# Patient Record
Sex: Male | Born: 1975 | Race: White | Hispanic: No | Marital: Married | Smoking: Never smoker
Health system: Southern US, Community
[De-identification: ages and names within clinical notes are randomized; demographics above are authoritative.]

## PROBLEM LIST (undated history)

## (undated) DIAGNOSIS — I10 Essential (primary) hypertension: Secondary | ICD-10-CM

---

## 2008-01-08 ENCOUNTER — Emergency Department (HOSPITAL_COMMUNITY): Admission: EM | Admit: 2008-01-08 | Discharge: 2008-01-08 | Payer: Self-pay | Admitting: Emergency Medicine

## 2016-02-06 DIAGNOSIS — D3131 Benign neoplasm of right choroid: Secondary | ICD-10-CM | POA: Diagnosis not present

## 2016-07-26 DIAGNOSIS — Z0000 Encounter for general adult medical examination without abnormal findings: Secondary | ICD-10-CM | POA: Diagnosis not present

## 2016-07-26 DIAGNOSIS — Z23 Encounter for immunization: Secondary | ICD-10-CM | POA: Diagnosis not present

## 2016-07-31 DIAGNOSIS — Z13220 Encounter for screening for lipoid disorders: Secondary | ICD-10-CM | POA: Diagnosis not present

## 2016-07-31 DIAGNOSIS — I10 Essential (primary) hypertension: Secondary | ICD-10-CM | POA: Diagnosis not present

## 2016-08-24 DIAGNOSIS — H9201 Otalgia, right ear: Secondary | ICD-10-CM | POA: Diagnosis not present

## 2016-08-24 DIAGNOSIS — H65191 Other acute nonsuppurative otitis media, right ear: Secondary | ICD-10-CM | POA: Diagnosis not present

## 2017-07-30 DIAGNOSIS — Z0000 Encounter for general adult medical examination without abnormal findings: Secondary | ICD-10-CM | POA: Diagnosis not present

## 2017-07-30 DIAGNOSIS — Z13220 Encounter for screening for lipoid disorders: Secondary | ICD-10-CM | POA: Diagnosis not present

## 2017-07-30 DIAGNOSIS — I10 Essential (primary) hypertension: Secondary | ICD-10-CM | POA: Diagnosis not present

## 2018-08-07 DIAGNOSIS — I10 Essential (primary) hypertension: Secondary | ICD-10-CM | POA: Diagnosis not present

## 2018-08-07 DIAGNOSIS — Z6833 Body mass index (BMI) 33.0-33.9, adult: Secondary | ICD-10-CM | POA: Diagnosis not present

## 2018-08-07 DIAGNOSIS — Z0000 Encounter for general adult medical examination without abnormal findings: Secondary | ICD-10-CM | POA: Diagnosis not present

## 2018-08-07 DIAGNOSIS — Z125 Encounter for screening for malignant neoplasm of prostate: Secondary | ICD-10-CM | POA: Diagnosis not present

## 2018-11-15 ENCOUNTER — Other Ambulatory Visit: Payer: Self-pay

## 2018-11-15 ENCOUNTER — Emergency Department (HOSPITAL_BASED_OUTPATIENT_CLINIC_OR_DEPARTMENT_OTHER): Payer: BLUE CROSS/BLUE SHIELD

## 2018-11-15 ENCOUNTER — Encounter (HOSPITAL_BASED_OUTPATIENT_CLINIC_OR_DEPARTMENT_OTHER): Payer: Self-pay | Admitting: Emergency Medicine

## 2018-11-15 ENCOUNTER — Emergency Department (HOSPITAL_BASED_OUTPATIENT_CLINIC_OR_DEPARTMENT_OTHER)
Admission: EM | Admit: 2018-11-15 | Discharge: 2018-11-15 | Disposition: A | Discharge status: Home / Self Care | Payer: BLUE CROSS/BLUE SHIELD | Attending: Emergency Medicine | Admitting: Emergency Medicine

## 2018-11-15 DIAGNOSIS — S82831A Other fracture of upper and lower end of right fibula, initial encounter for closed fracture: Secondary | ICD-10-CM

## 2018-11-15 DIAGNOSIS — Y9389 Activity, other specified: Secondary | ICD-10-CM | POA: Diagnosis not present

## 2018-11-15 DIAGNOSIS — Y998 Other external cause status: Secondary | ICD-10-CM | POA: Insufficient documentation

## 2018-11-15 DIAGNOSIS — W1789XA Other fall from one level to another, initial encounter: Secondary | ICD-10-CM | POA: Diagnosis not present

## 2018-11-15 DIAGNOSIS — Y929 Unspecified place or not applicable: Secondary | ICD-10-CM | POA: Diagnosis not present

## 2018-11-15 DIAGNOSIS — I10 Essential (primary) hypertension: Secondary | ICD-10-CM | POA: Insufficient documentation

## 2018-11-15 DIAGNOSIS — S8991XA Unspecified injury of right lower leg, initial encounter: Secondary | ICD-10-CM | POA: Diagnosis not present

## 2018-11-15 HISTORY — DX: Essential (primary) hypertension: I10

## 2018-11-15 MED ORDER — IBUPROFEN 400 MG PO TABS
600.0000 mg | ORAL_TABLET | Freq: Once | ORAL | Status: AC
  Administered 2018-11-15: 600 mg via ORAL
  Filled 2018-11-15: qty 1

## 2018-11-15 MED ORDER — OXYCODONE-ACETAMINOPHEN 5-325 MG PO TABS
1.0000 | ORAL_TABLET | Freq: Once | ORAL | Status: AC
  Administered 2018-11-15: 16:00:00 1 via ORAL
  Filled 2018-11-15: qty 1

## 2018-11-15 NOTE — ED Provider Notes (Signed)
[facility] EMERGENCY DEPARTMENT Provider Note   CSN: [id number] Arrival date & time: 11/15/18  1439    History   Chief Complaint Chief Complaint  Patient presents with  . Fall    HPI Anthony Ellison is a 43 y.o. male.     HPI 43 year old male was working on a ladder when the ladder began to slide.  He landed on the ground injuring his right ankle.  She reported he struck his head as well but is not on blood thinners and denies headache or vomiting.  Denies neck pain.  Denies weakness in his arms or legs.  No back pain.  No hip or knee pain.  Pain is focused to the right ankle and is moderate in severity.  No medications prior to arrival.  Presents with swelling to the right ankle.   Past Medical History:  Diagnosis Date  . Hypertension     There are no active problems to display for this patient.   History reviewed. No pertinent surgical history.      Home Medications    Prior to Admission medications   Not on File    Family History No family history on file.  Social History Social History   Tobacco Use  . Smoking status: Never Smoker  . Smokeless tobacco: Never Used  Substance Use Topics  . Alcohol use: Not Currently  . Drug use: Not on file     Allergies   Patient has no known allergies.   Review of Systems Review of Systems  All other systems reviewed and are negative.    Physical Exam Updated Vital Signs BP 112/76 (BP Location: Right Arm)   Pulse 78   Temp 98.7 F (37.1 C) (Oral)   Resp 16   Ht 5\' 10"  (1.778 m)   Wt 104.3 kg   SpO2 100%   BMI 33.00 kg/m   Physical Exam Vitals signs and nursing note reviewed.  Constitutional:      Appearance: He is well-developed.  HENT:     Head: Normocephalic.  Neck:     Musculoskeletal: Normal range of motion.  Pulmonary:     Effort: Pulmonary effort is normal.  Abdominal:     General: There is no distension.  Musculoskeletal: Normal range of motion.     Comments: Normal  pulses right foot.  Swelling of both the medial lateral malleolus with tenderness over the lateral malleolus.  Full range of motion of right hip and right knee.  Neurological:     Mental Status: He is alert and oriented to person, place, and time.      ED Treatments / Results  Labs (all labs ordered are listed, but only abnormal results are displayed) Labs Reviewed - No data to display  EKG None  Radiology Dg Ankle Complete Right  Result Date: 11/15/2018 CLINICAL DATA:  Pain after trauma EXAM: RIGHT ANKLE - COMPLETE 3+ VIEW COMPARISON:  None. FINDINGS: Soft tissue swelling is seen, particularly medially. There is a fracture through the distal tip of the fibula. No other fractures noted. IMPRESSION: Soft tissue swelling is identified, most prominent medially. There is a fracture through the distal tip of the fibula. No other abnormalities. Electronically Signed   By: Gerome Sam III M.D   On: 11/15/2018 15:11    Procedures .Splint Application Performed by: Azalia Bilis, MD Authorized by: Azalia Bilis, MD     SPLINT APPLICATION Authorized by: Azalia Bilis Consent: Verbal consent obtained. Risks and benefits: risks, benefits and alternatives were  discussed Consent given by: patient Splint applied by: orthopedic technician Location details: right lower extremity Splint type: cam walker Supplies used: cam walker Post-procedure: The splinted body part was neurovascularly unchanged following the procedure. Patient tolerance: Patient tolerated the procedure well with no immediate complications.     Medications Ordered in ED Medications  ibuprofen (ADVIL) tablet 600 mg (600 mg Oral Given 11/15/18 1537)  oxyCODONE-acetaminophen (PERCOCET/ROXICET) 5-325 MG per tablet 1 tablet (1 tablet Oral Given 11/15/18 1537)     Initial Impression / Assessment and Plan / ED Course  I have reviewed the triage vital signs and the nursing notes.  Pertinent labs & imaging results that were  available during my care of the patient were reviewed by me and considered in my medical decision making (see chart for details).        CAM Walker.  Crutches.  Orthopedic follow-up.  No other injury.  Elevation ice recommended  Final Clinical Impressions(s) / ED Diagnoses   Final diagnoses:  Closed fracture of distal end of right fibula, unspecified fracture morphology, initial encounter    ED Discharge Orders    None       Azalia Bilisampos, [place] Zaragoza, MD 11/15/18 1654

## 2018-11-15 NOTE — Discharge Instructions (Signed)
Ibuprofen Ice Call the orthopedist for follow up.

## 2018-11-15 NOTE — ED Notes (Signed)
ED Provider at bedside. 

## 2018-11-15 NOTE — ED Notes (Addendum)
Patient  X-ray at bedside 

## 2018-11-15 NOTE — ED Triage Notes (Signed)
Pt states he fell 10 feet off of a ladder only injuring his R ankle. Denies LOC

## 2018-11-18 DIAGNOSIS — S8264XA Nondisplaced fracture of lateral malleolus of right fibula, initial encounter for closed fracture: Secondary | ICD-10-CM | POA: Diagnosis not present

## 2018-11-18 DIAGNOSIS — M25571 Pain in right ankle and joints of right foot: Secondary | ICD-10-CM | POA: Diagnosis not present

## 2018-12-09 DIAGNOSIS — M25571 Pain in right ankle and joints of right foot: Secondary | ICD-10-CM | POA: Diagnosis not present

## 2018-12-09 DIAGNOSIS — S8264XA Nondisplaced fracture of lateral malleolus of right fibula, initial encounter for closed fracture: Secondary | ICD-10-CM | POA: Diagnosis not present

## 2018-12-10 DIAGNOSIS — S8261XD Displaced fracture of lateral malleolus of right fibula, subsequent encounter for closed fracture with routine healing: Secondary | ICD-10-CM | POA: Diagnosis not present

## 2019-06-29 DIAGNOSIS — H5213 Myopia, bilateral: Secondary | ICD-10-CM | POA: Diagnosis not present

## 2019-11-17 DIAGNOSIS — Z125 Encounter for screening for malignant neoplasm of prostate: Secondary | ICD-10-CM | POA: Diagnosis not present

## 2019-11-17 DIAGNOSIS — H9202 Otalgia, left ear: Secondary | ICD-10-CM | POA: Diagnosis not present

## 2019-11-17 DIAGNOSIS — I10 Essential (primary) hypertension: Secondary | ICD-10-CM | POA: Diagnosis not present

## 2019-11-22 DIAGNOSIS — R110 Nausea: Secondary | ICD-10-CM | POA: Diagnosis not present

## 2019-11-22 DIAGNOSIS — H6692 Otitis media, unspecified, left ear: Secondary | ICD-10-CM | POA: Diagnosis not present

## 2019-11-22 DIAGNOSIS — T887XXA Unspecified adverse effect of drug or medicament, initial encounter: Secondary | ICD-10-CM | POA: Diagnosis not present

## 2020-02-05 ENCOUNTER — Other Ambulatory Visit: Payer: Self-pay

## 2020-02-05 ENCOUNTER — Emergency Department (HOSPITAL_COMMUNITY): Payer: BC Managed Care – PPO

## 2020-02-05 ENCOUNTER — Emergency Department (HOSPITAL_COMMUNITY)
Admission: EM | Admit: 2020-02-05 | Discharge: 2020-02-05 | Disposition: A | Discharge status: Home / Self Care | Payer: BC Managed Care – PPO | Attending: Emergency Medicine | Admitting: Emergency Medicine

## 2020-02-05 DIAGNOSIS — Y998 Other external cause status: Secondary | ICD-10-CM | POA: Diagnosis not present

## 2020-02-05 DIAGNOSIS — S52592A Other fractures of lower end of left radius, initial encounter for closed fracture: Secondary | ICD-10-CM | POA: Diagnosis not present

## 2020-02-05 DIAGNOSIS — Y9389 Activity, other specified: Secondary | ICD-10-CM | POA: Insufficient documentation

## 2020-02-05 DIAGNOSIS — R52 Pain, unspecified: Secondary | ICD-10-CM | POA: Diagnosis not present

## 2020-02-05 DIAGNOSIS — S52502A Unspecified fracture of the lower end of left radius, initial encounter for closed fracture: Secondary | ICD-10-CM

## 2020-02-05 DIAGNOSIS — R609 Edema, unspecified: Secondary | ICD-10-CM | POA: Diagnosis not present

## 2020-02-05 DIAGNOSIS — W010XXA Fall on same level from slipping, tripping and stumbling without subsequent striking against object, initial encounter: Secondary | ICD-10-CM | POA: Diagnosis not present

## 2020-02-05 DIAGNOSIS — S52592D Other fractures of lower end of left radius, subsequent encounter for closed fracture with routine healing: Secondary | ICD-10-CM | POA: Diagnosis not present

## 2020-02-05 DIAGNOSIS — I10 Essential (primary) hypertension: Secondary | ICD-10-CM | POA: Diagnosis not present

## 2020-02-05 DIAGNOSIS — R110 Nausea: Secondary | ICD-10-CM | POA: Diagnosis not present

## 2020-02-05 DIAGNOSIS — R0902 Hypoxemia: Secondary | ICD-10-CM | POA: Diagnosis not present

## 2020-02-05 DIAGNOSIS — Y9289 Other specified places as the place of occurrence of the external cause: Secondary | ICD-10-CM | POA: Diagnosis not present

## 2020-02-05 DIAGNOSIS — W19XXXA Unspecified fall, initial encounter: Secondary | ICD-10-CM

## 2020-02-05 DIAGNOSIS — M25532 Pain in left wrist: Secondary | ICD-10-CM | POA: Diagnosis not present

## 2020-02-05 MED ORDER — HYDROCODONE-ACETAMINOPHEN 5-325 MG PO TABS: 2.0000 | ORAL_TABLET | Freq: Four times a day (QID) | ORAL | 0 refills | Status: AC | PRN

## 2020-02-05 MED ORDER — ONDANSETRON HCL 4 MG/2ML IJ SOLN
4.0000 mg | Freq: Once | INTRAMUSCULAR | Status: AC
  Administered 2020-02-05: 4 mg via INTRAVENOUS
  Filled 2020-02-05: qty 2

## 2020-02-05 MED ORDER — LIDOCAINE HCL (PF) 1 % IJ SOLN
5.0000 mL | Freq: Once | INTRAMUSCULAR | Status: AC
  Administered 2020-02-05: 5 mL
  Filled 2020-02-05: qty 30

## 2020-02-05 MED ORDER — BUPIVACAINE HCL 0.5 % IJ SOLN: 50.0000 mL | Freq: Once | INTRAMUSCULAR | Status: DC

## 2020-02-05 MED ORDER — BUPIVACAINE HCL 0.25 % IJ SOLN: 5.0000 mL | Freq: Once | INTRAMUSCULAR | Status: DC

## 2020-02-05 MED ORDER — SODIUM CHLORIDE 0.9 % IV BOLUS
1000.0000 mL | Freq: Once | INTRAVENOUS | Status: AC
  Administered 2020-02-05: 1000 mL via INTRAVENOUS

## 2020-02-05 MED ORDER — HYDROMORPHONE HCL 1 MG/ML IJ SOLN
1.0000 mg | Freq: Once | INTRAMUSCULAR | Status: AC
  Administered 2020-02-05: 1 mg via INTRAVENOUS
  Filled 2020-02-05: qty 1

## 2020-02-05 NOTE — Progress Notes (Signed)
Orthopedic Tech Progress Note Patient Details:  Anthony Ellison [birth year] [id number]  Patient ID: Anthony Ellison, male   DOB: 05-11-1976, 44 y.o.   MRN: [id number]   [name] 02/05/2020, 10:28 PMER Doctor applied splint

## 2020-02-05 NOTE — Discharge Instructions (Addendum)
Take ibuprofen 3 times a day with meals.  Do not take other anti-inflammatories at the same time (Advil, Motrin, naproxen, Aleve). Use Tylenol as needed for mild to moderate pain.  Use Norco as needed for severe breakthrough pain.  Have caution, this may make you tired or groggy.  Do not drive or operate machinery with taking this medicine. Keep your arm elevated to decrease swelling. Use ice to up with pain and swelling. Call the hand doctor listed below to set up a follow-up appointment. Return to the emergency room if you develop severe worsening pain, numbness of your fingers, color change of your fingers, or any new, worsening, or concerning symptoms.

## 2020-02-05 NOTE — Consult Note (Signed)
I was contacted via the telephone about the management of the patient.  The patient's radiographs were reviewed pre and post reduction films.  Patient does have a highly comminuted and unstable distal radius fracture.  This is going to require operative intervention.  Patient be placed into a splint tonight for comfort.  Will require stabilization later on the week in the form of open reduction internal fixation.  Patient will need to be seen in our office on Monday.  He can come to the office on Monday at 12:30 PM to be seen.  He should call the office first thing Monday morning but if he is to arrive at 1230 that is fine just as well.

## 2020-02-05 NOTE — ED Provider Notes (Addendum)
[facility]-EMERGENCY DEPT Provider Note   CSN: [id number] Arrival date & time: 02/05/20  1859     History Chief Complaint  Patient presents with  . Wrist Pain    Anthony Ellison is a 44 y.o. male presenting for evaluation of left wrist pain.  Patient states this prior to arrival he was approximately 2 feet up on a ladder when he slipped and fell onto his left side.  He denies hitting his head or loss of consciousness.  He does not know how he landed, with his arm under him or outstretched.  He has ambulated since.  He reports pain only in his left wrist.  No headache, vision changes, slurred speech, neck pain, back pain, chest pain, nausea, vomiting, Donnell pain, loss of bladder control, numbness, tingling.  He is not on blood thinners.  He has a history of high blood pressure for which he takes medication, no other medical problems.  HPI     Past Medical History:  Diagnosis Date  . Hypertension     There are no problems to display for this patient.   No past surgical history on file.     No family history on file.  Social History   Tobacco Use  . Smoking status: Never Smoker  . Smokeless tobacco: Never Used  Substance Use Topics  . Alcohol use: Not Currently  . Drug use: Not on file    Home Medications Prior to Admission medications   Medication Sig Start Date End Date Taking? Authorizing Provider  HYDROcodone-acetaminophen (NORCO/VICODIN) 5-325 MG tablet Take 2 tablets by mouth every 6 (six) hours as needed. 02/05/20   Atthew Coutant, PA-C    Allergies    Patient has no known allergies.  Review of Systems   Review of Systems  Musculoskeletal: Positive for arthralgias.  Hematological: Does not bruise/bleed easily.    Physical Exam Updated Vital Signs BP (!) 155/75   Pulse 79   Temp 98 F (36.7 C)   Resp 19   Ht 5\' 11"  (1.803 m)   Wt 106.6 kg   SpO2 99%   BMI 32.78 kg/m   Physical Exam Vitals and nursing note  reviewed.  Constitutional:      General: He is not in acute distress.    Appearance: He is well-developed.     Comments: Appears uncomfortable due to pain  HENT:     Head: Normocephalic and atraumatic.  Eyes:     Conjunctiva/sclera: Conjunctivae normal.     Pupils: Pupils are equal, round, and reactive to light.  Cardiovascular:     Rate and Rhythm: Normal rate and regular rhythm.     Pulses: Normal pulses.  Pulmonary:     Effort: Pulmonary effort is normal. No respiratory distress.     Breath sounds: Normal breath sounds. No wheezing.  Abdominal:     General: There is no distension.     Palpations: Abdomen is soft. There is no mass.     Tenderness: There is no abdominal tenderness. There is no guarding or rebound.  Musculoskeletal:        General: Swelling, tenderness and deformity present.     Cervical back: Normal range of motion and neck supple.     Comments: Obvious deformity of the left wrist.  Radial pulse 2+ bilaterally.  Good distal sensation and cap refill of the fingers of the left hand.  No tenderness palpation of left forearm, elbow, or shoulder.  Superficial skin tear of the ulnar aspect  of the left wrist on the palmar side.  Skin:    General: Skin is warm and dry.     Capillary Refill: Capillary refill takes less than 2 seconds.  Neurological:     Mental Status: He is alert and oriented to person, place, and time.     ED Results / Procedures / Treatments   Labs (all labs ordered are listed, but only abnormal results are displayed) Labs Reviewed - No data to display  EKG None  Radiology DG Wrist 2 Views Left  Result Date: 02/05/2020 CLINICAL DATA:  Status post reduction EXAM: LEFT WRIST - 2 VIEW COMPARISON:  None. FINDINGS: There is interval reduction of the comminuted distal radius fracture. There appears to be dorsal apex angulation of the distal radius. Overlying soft tissue swelling is seen. Mild widening of the radioulnar joint is. IMPRESSION: Interval  reduction comminuted distal radius fracture with dorsal angulation. Electronically Signed   By: Jonna Clark M.D.   On: 02/05/2020 21:48   DG Wrist Complete Left  Result Date: 02/05/2020 CLINICAL DATA:  Postreduction EXAM: LEFT WRIST - COMPLETE 3+ VIEW COMPARISON:  02/05/2020 FINDINGS: Interval reduction and casting of the left wrist. Mild displacement continues at the distal left radial fracture, improved since prior study. No subluxation or dislocation. IMPRESSION: Continued mild posterior displacement of fracture fragments, improved since prior study. Electronically Signed   By: Charlett Nose M.D.   On: 02/05/2020 22:35   DG Wrist Complete Left  Result Date: 02/05/2020 CLINICAL DATA:  Fall, pain EXAM: LEFT WRIST - COMPLETE 3+ VIEW COMPARISON:  None. FINDINGS: There is an angulated and dorsally displaced fracture of the distal metadiaphysis of the left radius. The distal ulna is intact. The carpus proper is normally aligned. Soft tissues are unremarkable. IMPRESSION: There is an angulated and dorsally displaced fracture of the distal metadiaphysis of the left radius. The distal ulna is intact. The carpus proper is normally aligned. Electronically Signed   By: Lauralyn Primes M.D.   On: 02/05/2020 20:43    Procedures Reduction of fracture  Date/Time: 02/05/2020 11:26 PM Performed by: Alveria Apley, PA-C Authorized by: Alveria Apley, PA-C  Local anesthesia used: yes Anesthesia: hematoma block  Anesthesia: Local anesthesia used: yes Local Anesthetic: lidocaine 1% without epinephrine Anesthetic total: 10 mL  Sedation: Patient sedated: no  Patient tolerance: patient tolerated the procedure well with no immediate complications Comments: Hematoma block with 1% lidocaine performed.  Reduction performed with finger traps and 10 pound weights.  Pt tolerated well  .Splint Application  Date/Time: 02/05/2020 11:26 PM Performed by: Milagros Loll, MD Authorized by: Milagros Loll, MD    Consent:    Consent obtained:  Verbal   Consent given by:  Patient   Risks discussed:  Discoloration, numbness, pain and swelling Pre-procedure details:    Sensation:  Normal   Skin color:  Pink, warm, dry Procedure details:    Laterality:  Left   Location:  Arm   Arm:  L lower arm   Splint type:  Sugar tong   Supplies:  Plaster, cotton padding, elastic bandage and sling Post-procedure details:    Pain:  Improved   Sensation:  Normal   Skin color:  Pink, warm, dry   Patient tolerance of procedure:  Tolerated well, no immediate complications   (including critical care time)  Medications Ordered in ED Medications  HYDROmorphone (DILAUDID) injection 1 mg (1 mg Intravenous Given 02/05/20 2021)  ondansetron (ZOFRAN) injection 4 mg (4 mg Intravenous Given 02/05/20 2022)  sodium chloride 0.9 % bolus 1,000 mL (0 mLs Intravenous Stopped 02/05/20 2256)  lidocaine (PF) (XYLOCAINE) 1 % injection 5 mL (5 mLs Other Given by Other 02/05/20 2124)    ED Course  I have reviewed the triage vital signs and the nursing notes.  Pertinent labs & imaging results that were available during my care of the patient were reviewed by me and considered in my medical decision making (see chart for details).    MDM Rules/Calculators/A&P                          Patient presented for evaluation of left wrist pain after fall.  On exam, patient has an obvious deformity.  He is neuro intact.  Will obtain x-rays.  He has no injury elsewhere, did not hit his head.  I do not believe he needs imaging elsewhere at this time.  Initial x-ray viewed interpreted by me, shows fracture dislocation of the distal left radius.  No ulnar fracture.  While patient does have a superficial skin abrasion, this is on the ulnar aspect and does not communicate with the actual fracture.  This is not an open fracture.  Discussed with attending, Dr. Stevie Kern evaluated the patient.  Patient reports a mild tingling of his index and middle  finger, new since first evaluation.   Hematoma block performed and patient placed in finger traps. Repeat xray shows improvement. Discussed with Dr. Melvyn Novas from hand surgery, who feels patient is aligned enough that he can be placed in a splint and followed up outpatient.Plaster splint placed by Dr. Stevie Kern.   Repeat xray shows continued improvement.  Patient's sensation of his fingers has returned to baseline, no numbness or tingling.  He has good cap refill and coloration.  Discussed treatment with rest, ice, elevation, NSAIDs, and pain control.  Discussed importance of follow-up outpatient.  Patient like to follow-up with Dr. Amanda Pea (has seen him previously), information given.  At this time, patient appears safe for discharge.  Return precautions given.  Patient and wife state they understand and agree to plan.  Final Clinical Impression(s) / ED Diagnoses Final diagnoses:  Closed fracture of distal end of left radius, unspecified fracture morphology, initial encounter  Fall, initial encounter    Rx / DC Orders ED Discharge Orders         Ordered    HYDROcodone-acetaminophen (NORCO/VICODIN) 5-325 MG tablet  Every 6 hours PRN     Discontinue  Reprint     02/05/20 2224           Alveria Apley, PA-C 02/05/20 2328    Alveria Apley, PA-C 02/05/20 2329    Milagros Loll, MD 02/07/20 [phone]

## 2020-02-05 NOTE — Progress Notes (Signed)
Orthopedic Tech Progress Note Patient Details:  Anthony Ellison [birth year] [id number]  Ortho Devices Type of Ortho Device: Ace wrap, Finger trap, Sugartong splint Finger Trap Weight: left reduction Ortho Device/Splint Interventions: Application   Post Interventions Patient Tolerated: Well   [name] 02/05/2020, 9:29 PM

## 2020-02-05 NOTE — ED Triage Notes (Signed)
Patient fell off a ladder and broke fall with left wrist, PMS intact. Did not hit head, no LOC, no blood thinners. of Fentanyl given en route by EMS. 18g RAC. Patient is AxOx4, only daily med is Lisinopril.

## 2020-02-07 DIAGNOSIS — S52532A Colles' fracture of left radius, initial encounter for closed fracture: Secondary | ICD-10-CM | POA: Diagnosis not present

## 2020-02-09 DIAGNOSIS — G8918 Other acute postprocedural pain: Secondary | ICD-10-CM | POA: Diagnosis not present

## 2020-02-09 DIAGNOSIS — S52572A Other intraarticular fracture of lower end of left radius, initial encounter for closed fracture: Secondary | ICD-10-CM | POA: Diagnosis not present

## 2020-02-09 DIAGNOSIS — Y999 Unspecified external cause status: Secondary | ICD-10-CM | POA: Diagnosis not present

## 2020-02-09 DIAGNOSIS — X58XXXA Exposure to other specified factors, initial encounter: Secondary | ICD-10-CM | POA: Diagnosis not present

## 2020-02-22 DIAGNOSIS — S52532A Colles' fracture of left radius, initial encounter for closed fracture: Secondary | ICD-10-CM | POA: Diagnosis not present

## 2020-03-09 DIAGNOSIS — S52532A Colles' fracture of left radius, initial encounter for closed fracture: Secondary | ICD-10-CM | POA: Diagnosis not present

## 2020-03-09 DIAGNOSIS — M25532 Pain in left wrist: Secondary | ICD-10-CM | POA: Diagnosis not present

## 2020-03-14 DIAGNOSIS — M25532 Pain in left wrist: Secondary | ICD-10-CM | POA: Diagnosis not present

## 2020-03-17 DIAGNOSIS — M25532 Pain in left wrist: Secondary | ICD-10-CM | POA: Diagnosis not present

## 2020-03-21 DIAGNOSIS — M25532 Pain in left wrist: Secondary | ICD-10-CM | POA: Diagnosis not present

## 2020-03-28 DIAGNOSIS — M25532 Pain in left wrist: Secondary | ICD-10-CM | POA: Diagnosis not present

## 2020-03-30 DIAGNOSIS — M25532 Pain in left wrist: Secondary | ICD-10-CM | POA: Diagnosis not present

## 2020-03-30 DIAGNOSIS — S52532A Colles' fracture of left radius, initial encounter for closed fracture: Secondary | ICD-10-CM | POA: Diagnosis not present

## 2020-04-03 DIAGNOSIS — M25532 Pain in left wrist: Secondary | ICD-10-CM | POA: Diagnosis not present

## 2020-04-06 DIAGNOSIS — M25532 Pain in left wrist: Secondary | ICD-10-CM | POA: Diagnosis not present

## 2020-04-28 DIAGNOSIS — Z4789 Encounter for other orthopedic aftercare: Secondary | ICD-10-CM | POA: Diagnosis not present

## 2020-04-28 DIAGNOSIS — S52532D Colles' fracture of left radius, subsequent encounter for closed fracture with routine healing: Secondary | ICD-10-CM | POA: Diagnosis not present

## 2020-08-14 DIAGNOSIS — H5213 Myopia, bilateral: Secondary | ICD-10-CM | POA: Diagnosis not present

## 2020-12-19 DIAGNOSIS — Z23 Encounter for immunization: Secondary | ICD-10-CM | POA: Diagnosis not present

## 2020-12-19 DIAGNOSIS — Z0000 Encounter for general adult medical examination without abnormal findings: Secondary | ICD-10-CM | POA: Diagnosis not present

## 2020-12-19 DIAGNOSIS — Z125 Encounter for screening for malignant neoplasm of prostate: Secondary | ICD-10-CM | POA: Diagnosis not present

## 2020-12-19 DIAGNOSIS — I10 Essential (primary) hypertension: Secondary | ICD-10-CM | POA: Diagnosis not present

## 2020-12-19 DIAGNOSIS — Z13220 Encounter for screening for lipoid disorders: Secondary | ICD-10-CM | POA: Diagnosis not present

## 2021-03-29 DIAGNOSIS — L718 Other rosacea: Secondary | ICD-10-CM | POA: Diagnosis not present

## 2021-04-30 DIAGNOSIS — L718 Other rosacea: Secondary | ICD-10-CM | POA: Diagnosis not present

## 2021-05-26 IMAGING — DX DG WRIST COMPLETE 3+V*L*
2 series · 2 of 2 positions shown · non-contrast
Comparison: 02/05/2020

CLINICAL DATA: Postreduction

EXAM:
LEFT WRIST - COMPLETE 3+ VIEW

[wrist ap]
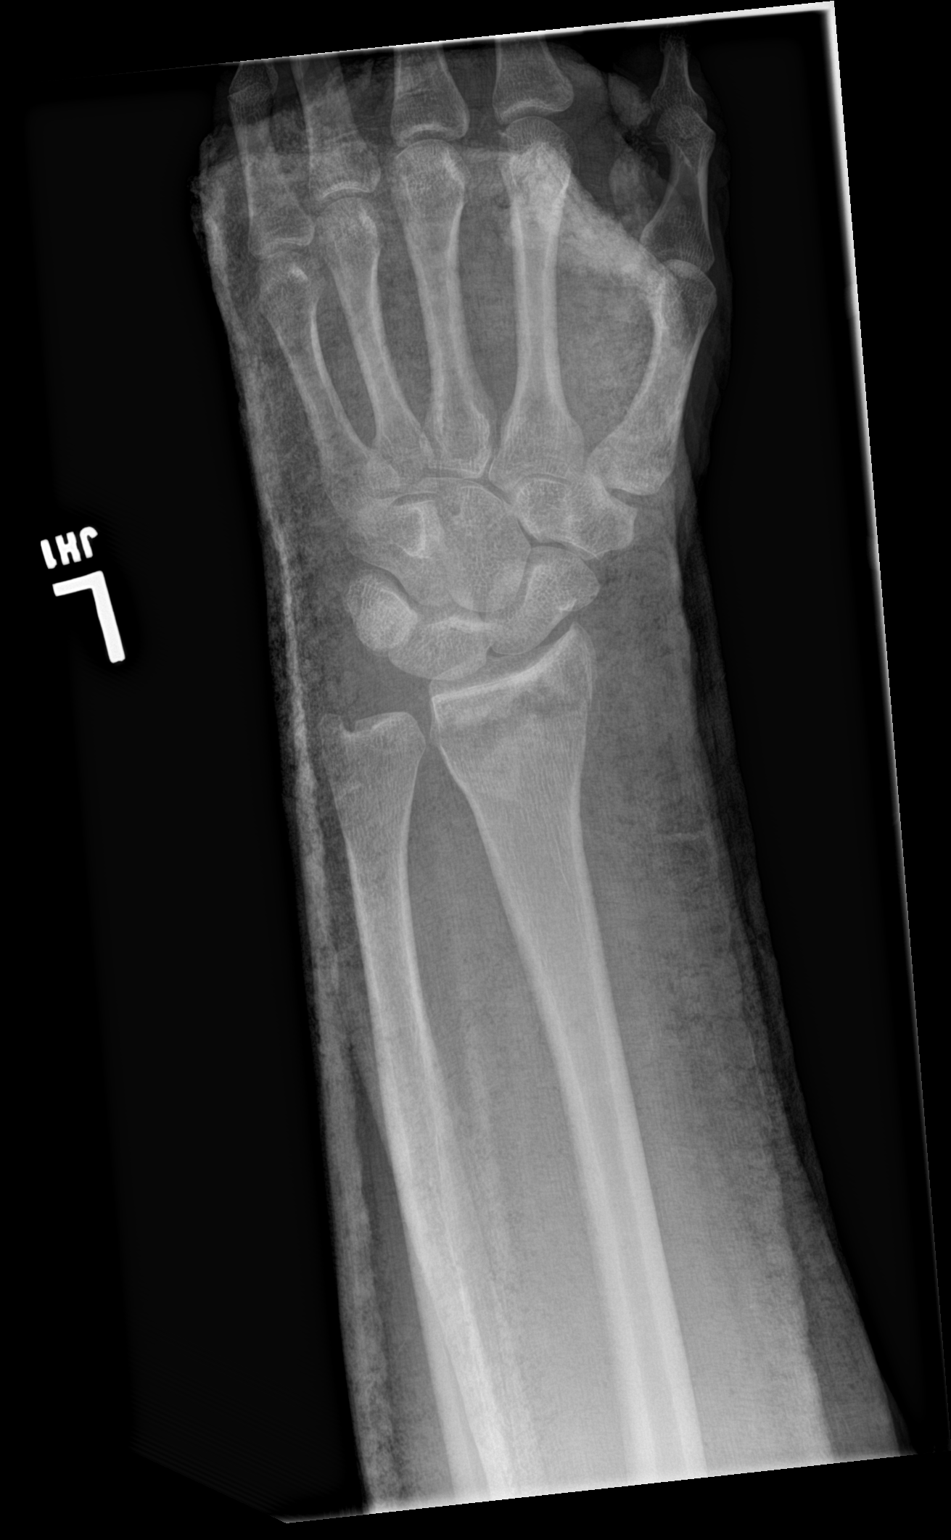

[wrist lat]
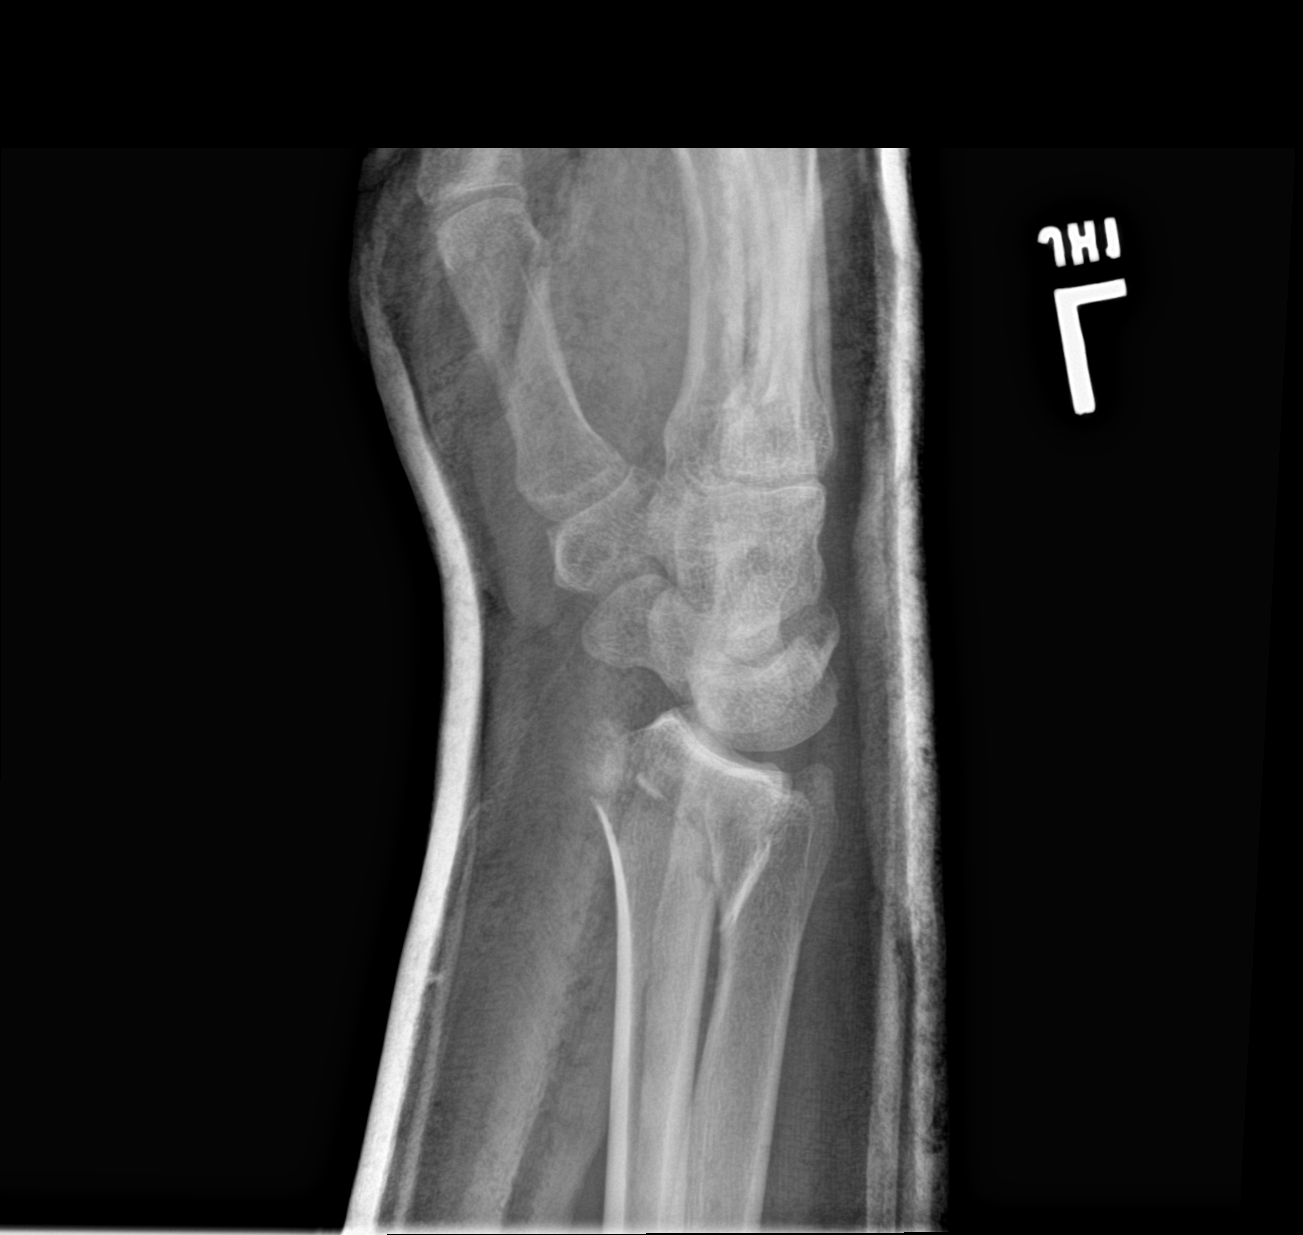

[2 of 2 positions shown; findings below may reference images not displayed]

FINDINGS: Interval reduction and casting of the left wrist. Mild displacement
continues at the distal left radial fracture, improved since prior
study. No subluxation or dislocation.
IMPRESSION: Continued mild posterior displacement of fracture fragments,
improved since prior study.

## 2021-05-26 IMAGING — CR DG WRIST COMPLETE 3+V*L*
4 series · 4 of 4 positions shown · non-contrast
Comparison: None.

CLINICAL DATA: Fall, pain

EXAM:
LEFT WRIST - COMPLETE 3+ VIEW

[x wrist pa left]
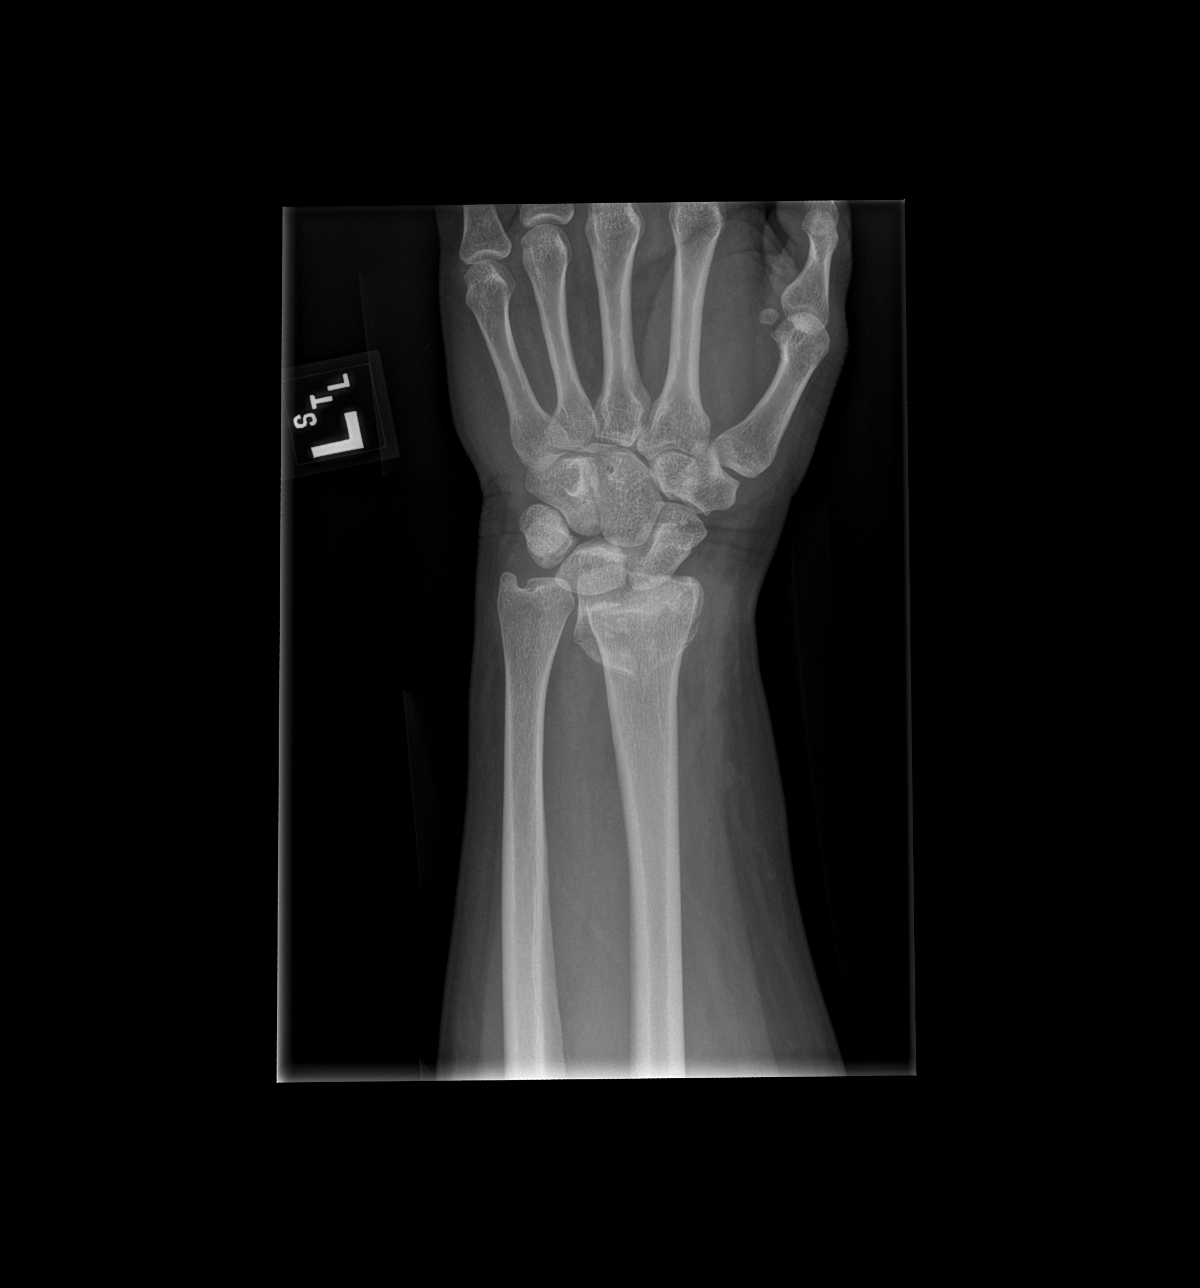

[x wrist obl left]
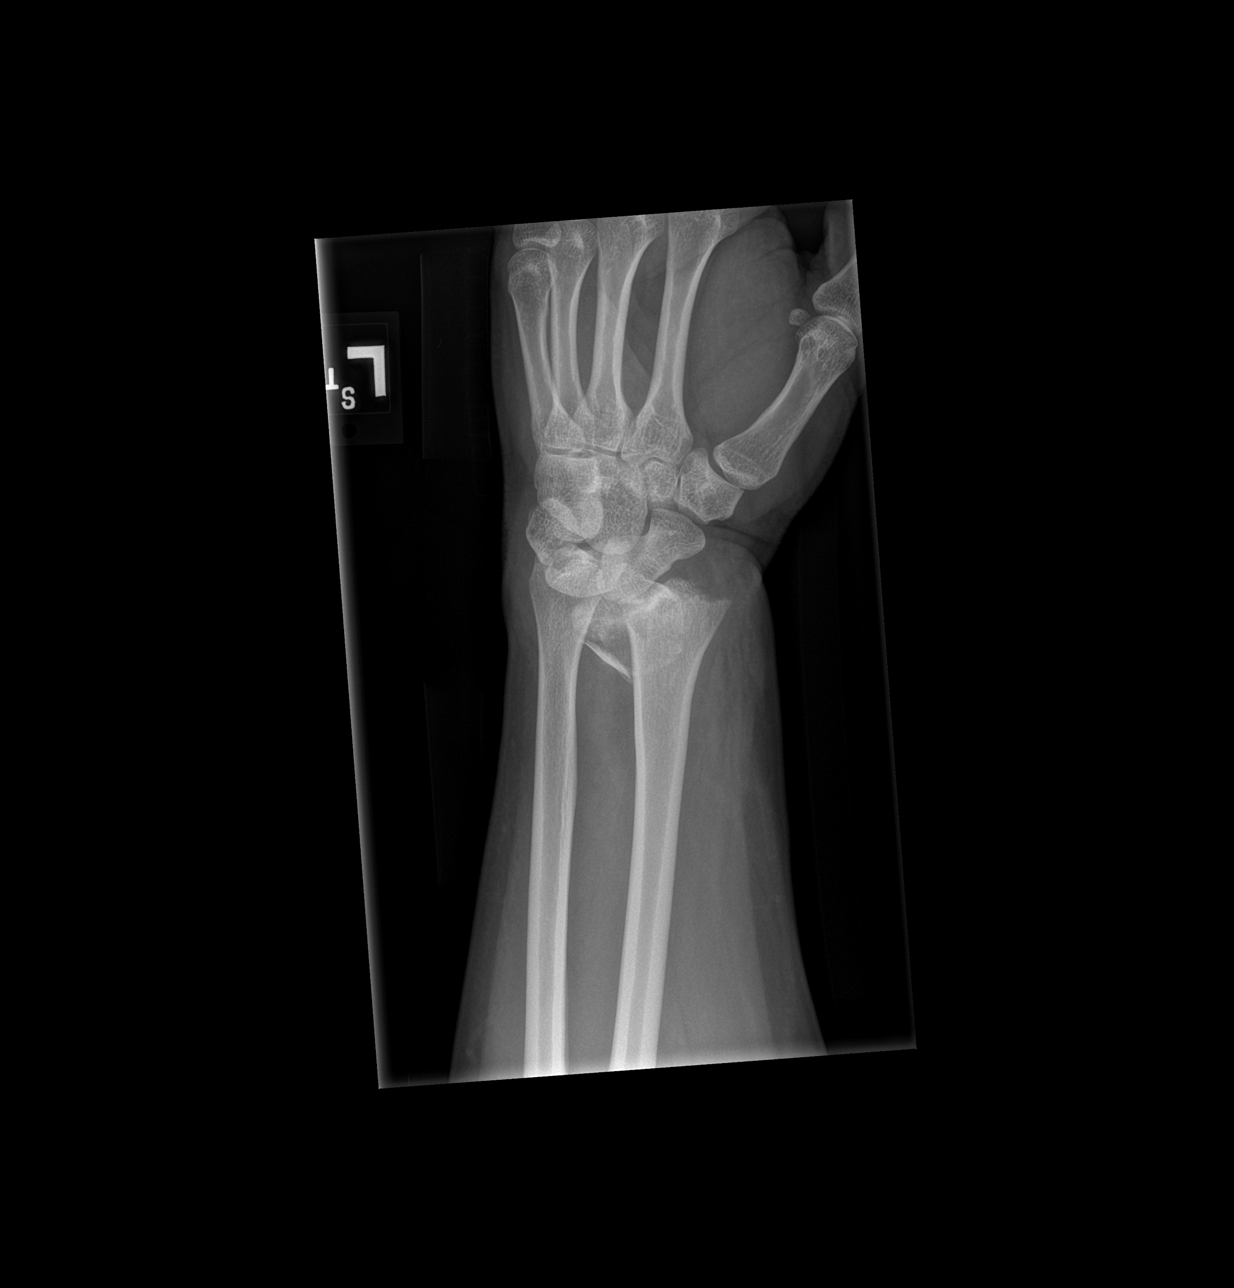

[x wrist lat left]
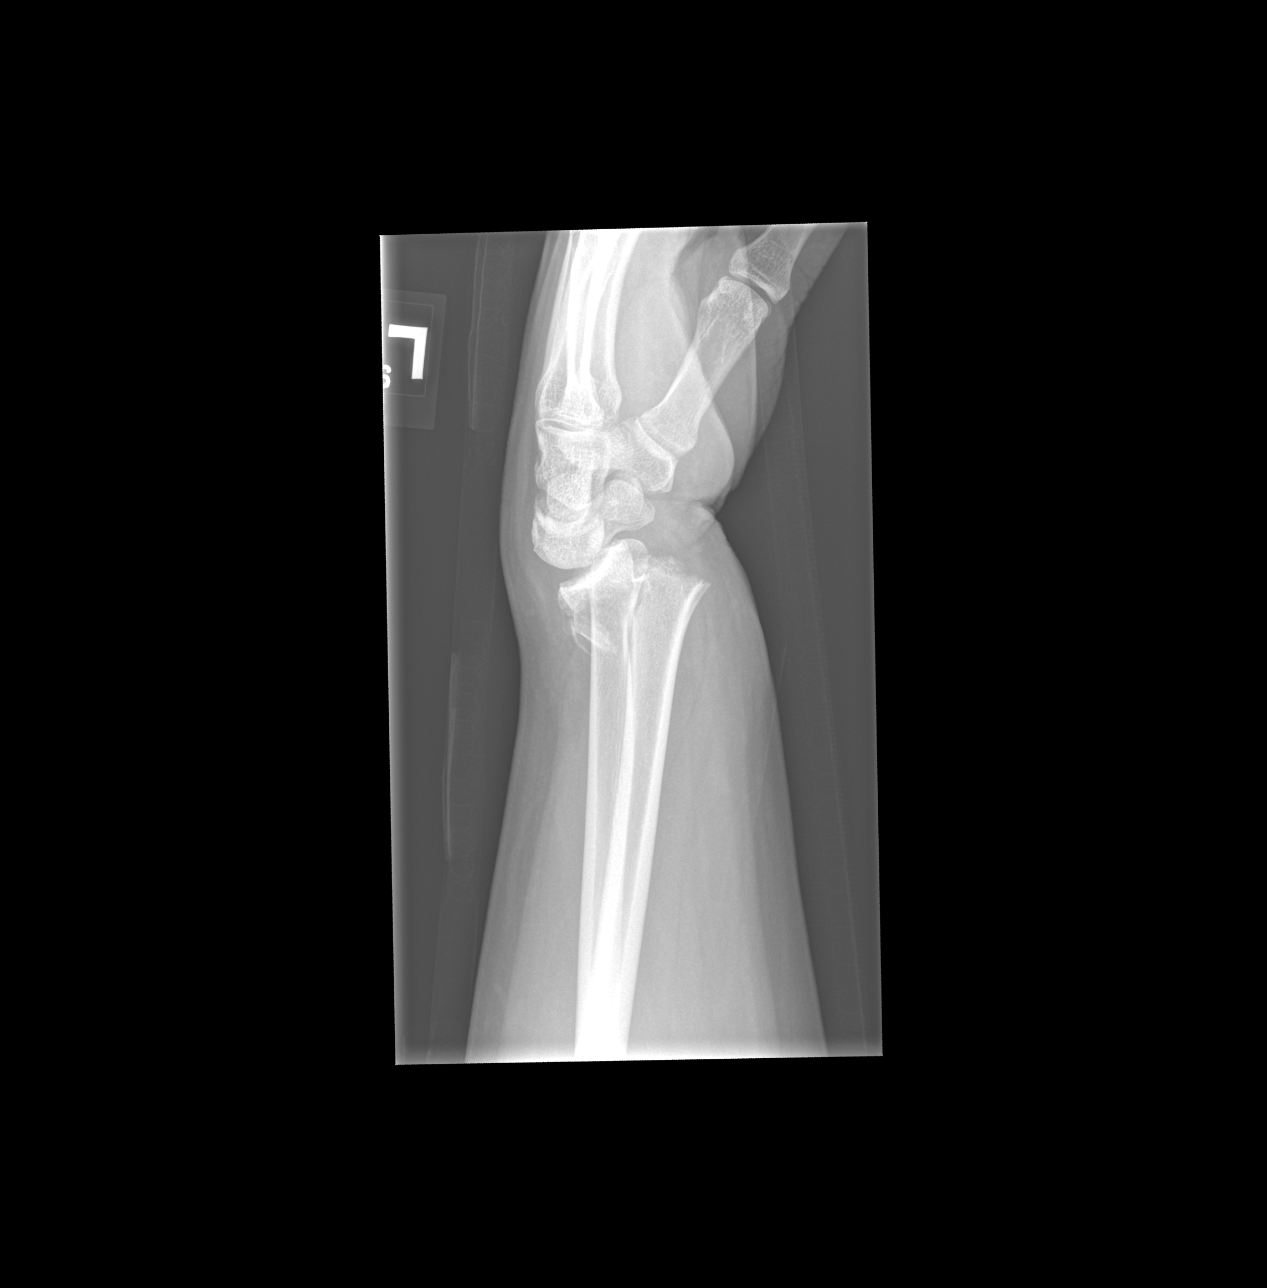

[x wrist navicular view left]
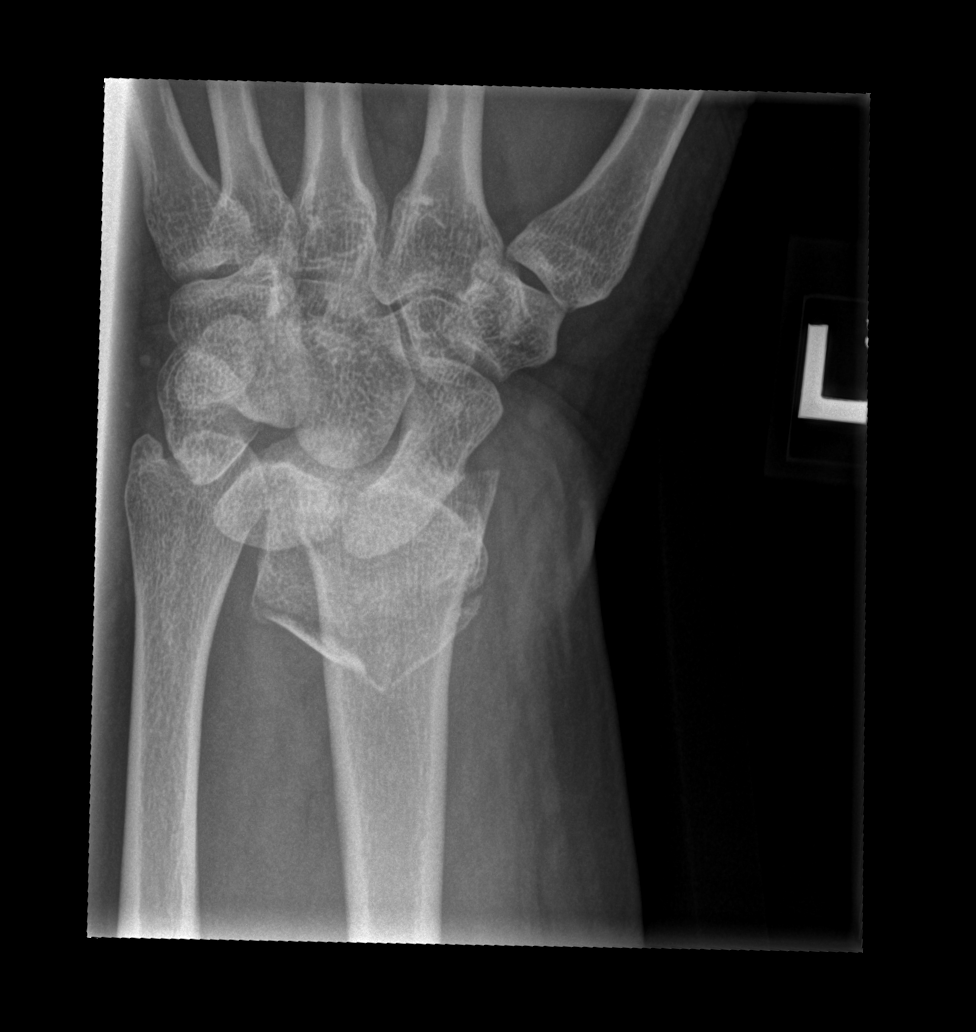

[4 of 4 positions shown; findings below may reference images not displayed]

FINDINGS: There is an angulated and dorsally displaced fracture of the distal
metadiaphysis of the left radius. The distal ulna is intact. The
carpus proper is normally aligned. Soft tissues are unremarkable.
IMPRESSION: There is an angulated and dorsally displaced fracture of the distal
metadiaphysis of the left radius. The distal ulna is intact. The
carpus proper is normally aligned.

## 2022-01-10 DIAGNOSIS — I10 Essential (primary) hypertension: Secondary | ICD-10-CM | POA: Diagnosis not present

## 2022-01-10 DIAGNOSIS — Z0000 Encounter for general adult medical examination without abnormal findings: Secondary | ICD-10-CM | POA: Diagnosis not present

## 2022-01-10 DIAGNOSIS — L719 Rosacea, unspecified: Secondary | ICD-10-CM | POA: Diagnosis not present

## 2022-01-10 DIAGNOSIS — Z125 Encounter for screening for malignant neoplasm of prostate: Secondary | ICD-10-CM | POA: Diagnosis not present

## 2022-01-10 DIAGNOSIS — Z13220 Encounter for screening for lipoid disorders: Secondary | ICD-10-CM | POA: Diagnosis not present

## 2022-02-08 DIAGNOSIS — S80261A Insect bite (nonvenomous), right knee, initial encounter: Secondary | ICD-10-CM | POA: Diagnosis not present

## 2022-02-22 DIAGNOSIS — J302 Other seasonal allergic rhinitis: Secondary | ICD-10-CM | POA: Diagnosis not present

## 2022-02-22 DIAGNOSIS — L719 Rosacea, unspecified: Secondary | ICD-10-CM | POA: Diagnosis not present

## 2022-02-22 DIAGNOSIS — I10 Essential (primary) hypertension: Secondary | ICD-10-CM | POA: Diagnosis not present

## 2022-05-10 DIAGNOSIS — R9431 Abnormal electrocardiogram [ECG] [EKG]: Secondary | ICD-10-CM | POA: Diagnosis not present

## 2022-05-10 DIAGNOSIS — Z8249 Family history of ischemic heart disease and other diseases of the circulatory system: Secondary | ICD-10-CM | POA: Diagnosis not present

## 2022-05-10 DIAGNOSIS — R0602 Shortness of breath: Secondary | ICD-10-CM | POA: Diagnosis not present

## 2022-05-30 DIAGNOSIS — I10 Essential (primary) hypertension: Secondary | ICD-10-CM | POA: Diagnosis not present

## 2022-05-30 DIAGNOSIS — R0602 Shortness of breath: Secondary | ICD-10-CM | POA: Diagnosis not present

## 2022-05-30 DIAGNOSIS — E669 Obesity, unspecified: Secondary | ICD-10-CM | POA: Diagnosis not present

## 2022-05-30 DIAGNOSIS — R9431 Abnormal electrocardiogram [ECG] [EKG]: Secondary | ICD-10-CM | POA: Diagnosis not present
